# Patient Record
Sex: Male | Born: 1965 | Race: White | Hispanic: No | Marital: Married | Smoking: Never smoker
Health system: Southern US, Community
[De-identification: ages and names within clinical notes are randomized; demographics above are authoritative.]

## PROBLEM LIST (undated history)

## (undated) HISTORY — PX: OTHER SURGICAL HISTORY: SHX169

## (undated) HISTORY — PX: TONSILLECTOMY: SUR1361

---

## 1999-12-20 ENCOUNTER — Other Ambulatory Visit: Admission: RE | Admit: 1999-12-20 | Discharge: 1999-12-20 | Payer: Self-pay | Admitting: Urology

## 2002-01-06 ENCOUNTER — Encounter: Payer: Self-pay | Admitting: Family Medicine

## 2002-01-06 ENCOUNTER — Encounter: Admission: RE | Admit: 2002-01-06 | Discharge: 2002-01-06 | Payer: Self-pay | Admitting: Family Medicine

## 2004-06-11 ENCOUNTER — Encounter: Admission: RE | Admit: 2004-06-11 | Discharge: 2004-06-11 | Payer: Self-pay | Admitting: Family Medicine

## 2012-06-06 ENCOUNTER — Encounter (HOSPITAL_COMMUNITY): Payer: Self-pay | Admitting: Physical Medicine and Rehabilitation

## 2012-06-06 ENCOUNTER — Emergency Department (HOSPITAL_COMMUNITY): Payer: 59

## 2012-06-06 ENCOUNTER — Emergency Department (HOSPITAL_COMMUNITY)
Admission: EM | Admit: 2012-06-06 | Discharge: 2012-06-06 | Disposition: A | Discharge status: Home / Self Care | Payer: 59 | Attending: Emergency Medicine | Admitting: Emergency Medicine

## 2012-06-06 DIAGNOSIS — W11XXXA Fall on and from ladder, initial encounter: Secondary | ICD-10-CM | POA: Insufficient documentation

## 2012-06-06 DIAGNOSIS — S9030XA Contusion of unspecified foot, initial encounter: Secondary | ICD-10-CM | POA: Insufficient documentation

## 2012-06-06 DIAGNOSIS — Y939 Activity, unspecified: Secondary | ICD-10-CM | POA: Insufficient documentation

## 2012-06-06 DIAGNOSIS — S93609A Unspecified sprain of unspecified foot, initial encounter: Secondary | ICD-10-CM | POA: Insufficient documentation

## 2012-06-06 DIAGNOSIS — T148XXA Other injury of unspecified body region, initial encounter: Secondary | ICD-10-CM

## 2012-06-06 DIAGNOSIS — Y929 Unspecified place or not applicable: Secondary | ICD-10-CM | POA: Insufficient documentation

## 2012-06-06 MED ORDER — OXYCODONE-ACETAMINOPHEN 5-325 MG PO TABS
1.0000 | ORAL_TABLET | Freq: Once | ORAL | Status: AC
  Administered 2012-06-06: 1 via ORAL
  Filled 2012-06-06: qty 1

## 2012-06-06 NOTE — ED Notes (Signed)
Pt presents to department for evaluation of L foot pain. States he fell of ladder this morning. Now states L heel pain and swelling. Pedal pulses present. CMS intact. 8/10 pain at the time. Pt is conscious alert and oriented x4. NAD.

## 2012-06-06 NOTE — ED Notes (Signed)
Pt demonstrated understanding and proper use of cam boot and crutches.

## 2012-06-06 NOTE — ED Notes (Signed)
Pt to xray via WC

## 2012-06-06 NOTE — Progress Notes (Signed)
Orthopedic Tech Progress Note Patient Details:  Scott Bradley [birth year] [id number]  Ortho Devices Type of Ortho Device: CAM walker;Crutches Ortho Device/Splint Location: left foot Ortho Device/Splint Interventions: Application   Scott Bradley 06/06/2012, 3:58 PM

## 2012-06-06 NOTE — ED Notes (Signed)
Family at bedside. 

## 2012-06-06 NOTE — ED Provider Notes (Addendum)
History    This chart was scribed for Scott Sprout, MD, MD by Smitty Pluck, ED Scribe. The patient was seen in room TR09C and the patient's care was started at 12:57PM.   CSN: [id number]  Arrival date & time 06/06/12  1224   First MD Initiated Contact with Patient 06/06/12 1256      Chief Complaint  Patient presents with  . Foot Pain    (Consider location/radiation/quality/duration/timing/severity/associated sxs/prior treatment) Patient is a 46 y.o. male presenting with lower extremity pain. The history is provided by the patient. No language interpreter was used.  Foot Pain Pertinent negatives include no shortness of breath.   Scott Bradley is a 46 y.o. male who presents to the Emergency Department complaining of constant, moderate left foot pain onset today 1 hour ago. The pain is throbbing. Pain is rated at 8/10. Pt reports that he fell from a ladder onto concrete. He denies back pain, leg pain, toe pain and any other pain.   No past medical history on file.  No past surgical history on file.  History reviewed. No pertinent family history.  History  Substance Use Topics  . Smoking status: Never Smoker   . Smokeless tobacco: Not on file  . Alcohol Use: Yes     Comment: occ      Review of Systems  Constitutional: Negative for fever and chills.  Respiratory: Negative for shortness of breath.   Gastrointestinal: Negative for nausea and vomiting.  Neurological: Negative for weakness.  All other systems reviewed and are negative.    Allergies  Review of patient's allergies indicates no known allergies.  Home Medications  No current outpatient prescriptions on file.  BP 149/94  Pulse 71  Temp 98 F (36.7 C) (Oral)  Resp 16  SpO2 97%  Physical Exam  Nursing note and vitals reviewed. Constitutional: He is oriented to person, place, and time. He appears well-developed and well-nourished. No distress.  HENT:  Head: Normocephalic and atraumatic.  Eyes:  EOM are normal.  Neck: Neck supple. No tracheal deviation present.  Pulmonary/Chest: Effort normal. No respiratory distress.  Musculoskeletal: Normal range of motion.       Left knee: Normal.       Cervical back: Normal.       Thoracic back: Normal.       Lumbar back: Normal.       calcaneus is tenderness, swollen Nl left achilles tendon  Nl left ankle   Neurological: He is alert and oriented to person, place, and time.  Skin: Skin is warm and dry.  Psychiatric: He has a normal mood and affect. His behavior is normal.    ED Course  Procedures (including critical care time) DIAGNOSTIC STUDIES: Oxygen Saturation is 97% on room air, normal by my interpretation.    COORDINATION OF CARE: 12:58 PM Discussed ED treatment with pt     Labs Reviewed - No data to display Ct Foot Left Wo Contrast  06/06/2012  *RADIOLOGY REPORT*  Clinical Data: .  From a ladder.  Medial bruising along the heel. Possible fracture.  CT OF THE LEFT FOOT WITHOUT CONTRAST  Technique:  Multidetector CT imaging was performed according to the standard protocol. Multiplanar CT image reconstructions were also generated.  Comparison: 06/06/2012 radiographs  Findings: Well corticated accessory navicular noted.  Well corticated bifid os peroneus noted adjacent to the cuboid.  No malalignment at the Lisfranc joint.  No metatarsal or phalangeal fracture observed.  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  Mild edema noted in Kager's fat pad.  There is also subcutaneous edema along the heel, particularly posteromedially, and also tracking along the thickened lateral band of the plantar fascia.  Faint linear calcifications node along the tibiotalar component of the deltoid ligament near the tibial attachment.   Slightly greater than expected fluid density noted along the deep tibiotalar components of the deltoid ligament.  Sprain of the tibiotalar component of the deltoid ligament suspected.  No cuboid  fracture or calcaneal fracture is observed.  IMPRESSION:  1.  Abnormal fusiform expansion of the Achilles tendon and may reflect tendinopathy or partial tearing.  There is low-level adjacent edema in Kager's fat pad. 2.  Greater than expected fluid signal fluid density along the tibiotalar component of the deltoid ligament, with some adjacent faint linear calcification, suspicious for sprain and perhaps subtle avulsion. 3.  Edema tracking along the heel, especially posteromedially, in the subcutaneous tissues. 4.  Thickened lateral band of the plantar fascia is of uncertain significance, but could reflect plantar fasciitis or conceivably plantar fascial injury.   Original Report Authenticated By: Gaylyn Rong, M.D.    Dg Foot Complete Left  06/06/2012  *RADIOLOGY REPORT*  Clinical Data: Pain after injury.  LEFT FOOT - COMPLETE 3+ VIEW  Comparison: None.  Findings: Examination demonstrates two small well corticated bony fragments adjacent the plantar lateral aspect of the cuboid likely chronic.  The lateral film demonstrates one of these fragments along the posterior plantar aspect of the cuboid although demonstrates findings suggesting the possibility of an acute chip fracture.  Remainder of the exam is unremarkable.  IMPRESSION: Two small bony fragments adjacent the plantar lateral aspect of the cuboid likely chronic although cannot completely exclude an acute chip fracture.   Original Report Authenticated By: Elberta Fortis, M.D.      1. Foot contusion   2. Ligament tear       MDM   Patient with a mechanical fall today about one hour ago from a ladder. He fell on hard dirt approximately 15-20 feet with complaints of pain in the left heel. He did not hit his head no LOC. He has no lumbar tenderness, left hip, knee or ankle pain. His Achilles tendon is intact. Swelling and severe pain over the calcaneus. Concern for calcaneal fracture and plain films are pending. Patient is neurovascularly intact  with normal sensation and pulses.  1:52 PM Plain films with 2 small bony fragments without definitive fx.  Concern for fracture due to pt's swelling and pain.  Will get CT to r/o occult fx.  3:34 PM No fracture seen. Multiple areas of contusion in ligamental injury. Patient was placed in a splint and will have followup with ortho in one week. I personally performed the services described in this documentation, which was scribed in my presence.  The recorded information has been reviewed and considered.     Scott Sprout, MD 06/06/12 1306  Scott Sprout, MD 06/06/12 1307  Scott Sprout, MD 06/06/12 1353  Scott Sprout, MD 06/06/12 1457  Scott Sprout, MD 06/06/12 1534  Scott Sprout, MD 06/06/12 1540

## 2012-06-06 NOTE — ED Notes (Signed)
Pt returned from CT °

## 2013-08-21 IMAGING — CT CT FOOT*L* W/O CM
5 of 6 series · 17 of 34 positions shown, 18 images · non-contrast
Comparison: 06/06/2012 radiographs

CLINICAL DATA: .  From a ladder.  Medial bruising along the heel.
Possible fracture.

CT OF THE LEFT FOOT WITHOUT CONTRAST
TECHNIQUE: Multidetector CT imaging was performed according to the
standard protocol. Multiplanar CT image reconstructions were also
generated.

[Series 5: lowextremity 2.0 b20s · axial · 0.59mm/px · z∈[-989,-913]mm · 3 of 76 slices shown, 4 images]
[im 19/76  soft-tissue]
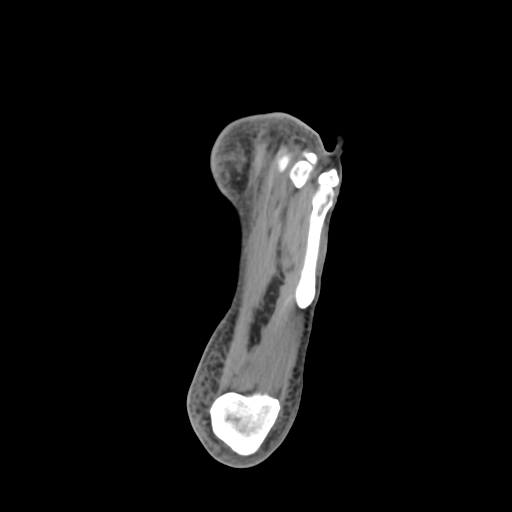
[im 19/76  bone]
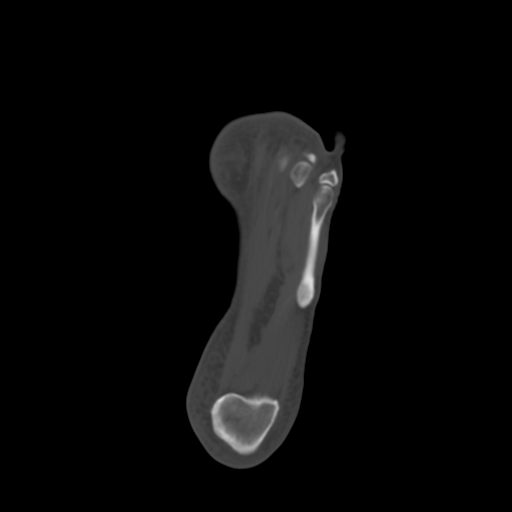
[im 38/76  bone]
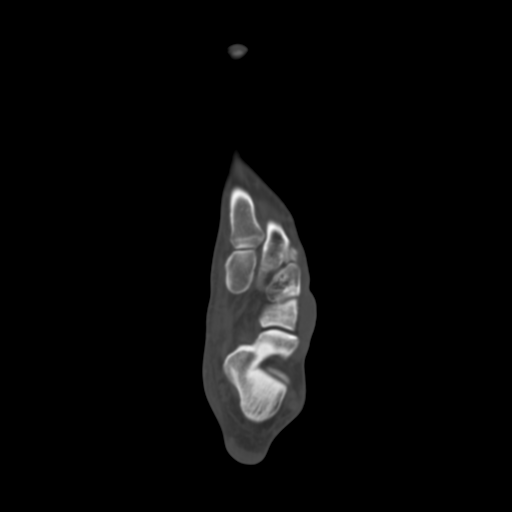
[im 57/76  bone]
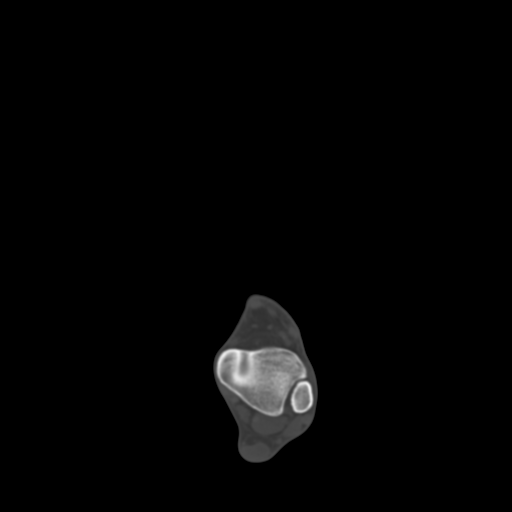

[Series 401: calc · sagittal · 0.51mm/px · 6 of 57 slices shown]
[im 9/57  bone]
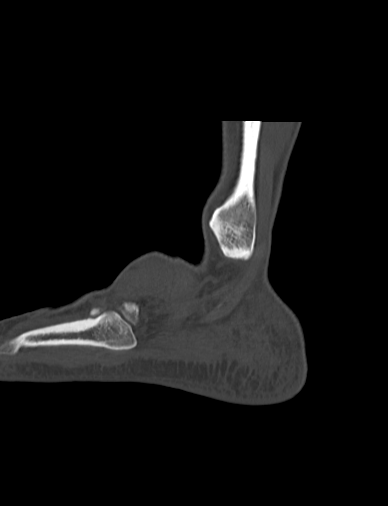
[im 17/57  bone]
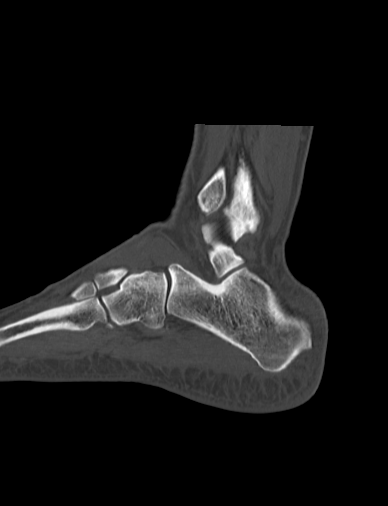
[im 21/57  soft-tissue]
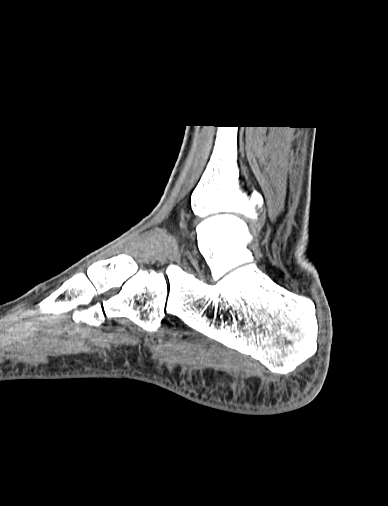
[im 25/57  bone]
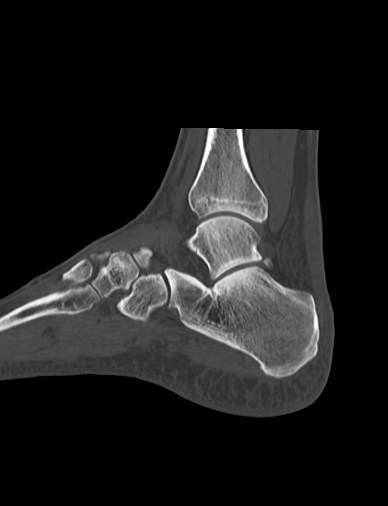
[im 33/57  bone]
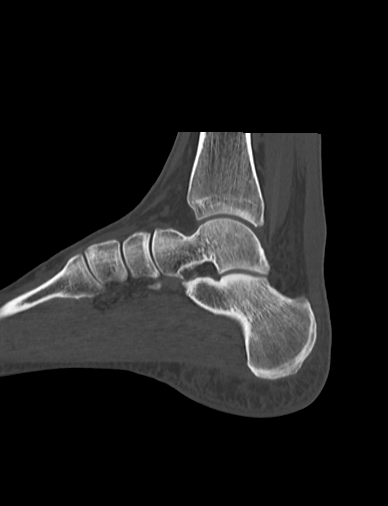
[im 41/57  bone]
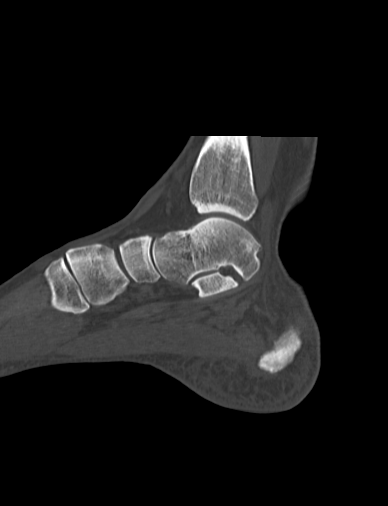

[Series 602: axial · coronal · 0.59mm/px · 3 of 130 slices shown]
[im 26/130  bone]
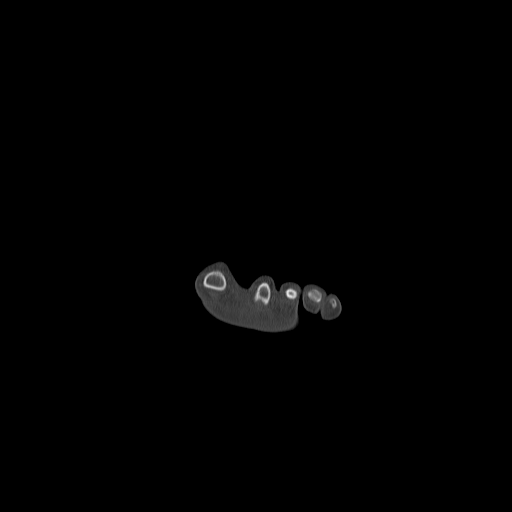
[im 52/130  bone]
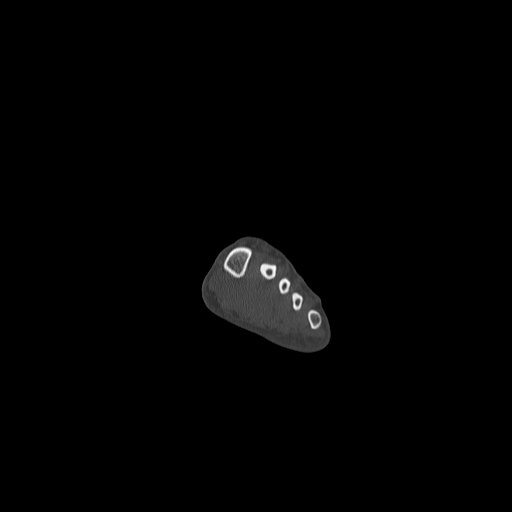
[im 78/130  bone]
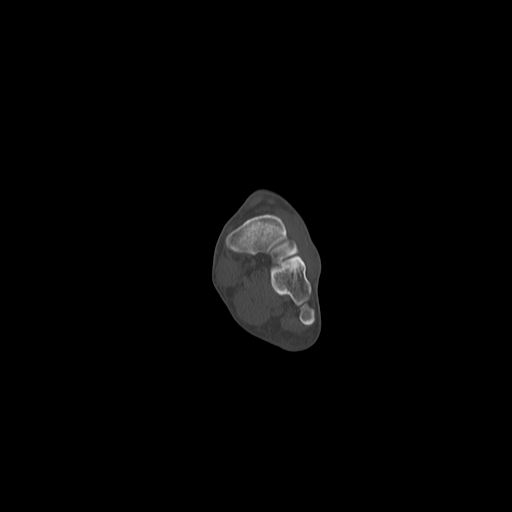

[Series 604: coronal · axial · 0.59mm/px · z∈[-952,-913]mm · 2 of 59 slices shown]
[im 20/59  bone]
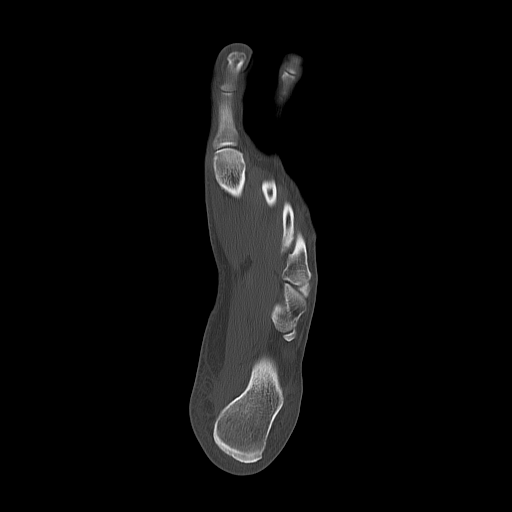
[im 39/59  bone]
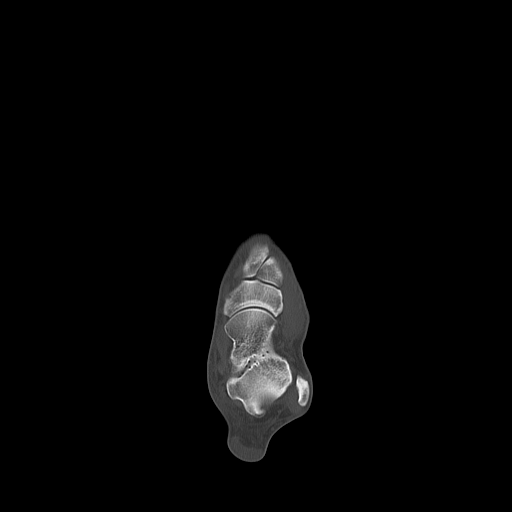

[Series 606: coronal tissue · axial · 0.59mm/px · z∈[-971,-906]mm · 3 of 66 slices shown]
[im 17/66  bone]
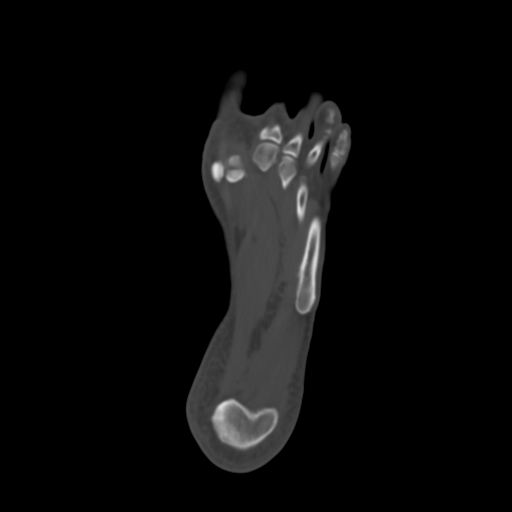
[im 33/66  bone]
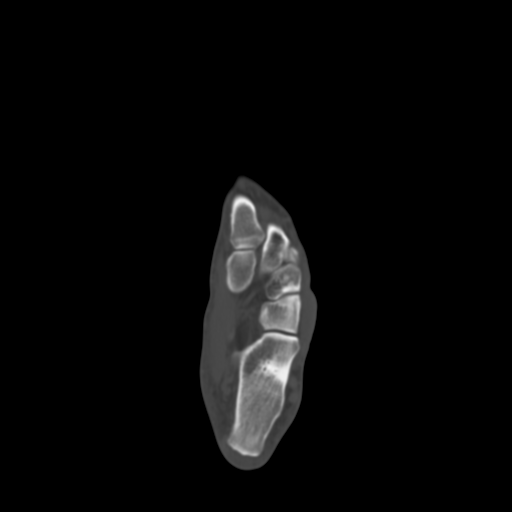
[im 49/66  bone]
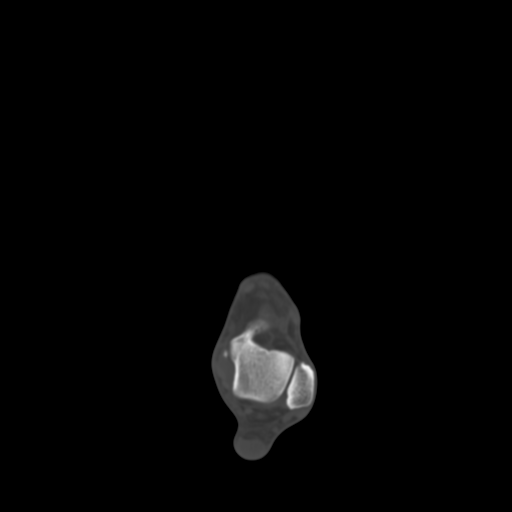

[17 of 34 positions shown; findings below may reference images not displayed]

FINDINGS: Well corticated accessory navicular noted.  Well
corticated bifid os peroneus noted adjacent to the cuboid.

No malalignment at the Lisfranc joint.  No metatarsal or phalangeal
fracture observed.

Fusiform expansion of the Achilles tendon favors Achilles
tendinopathy.  Mild edema noted in Kager's fat pad.  There is also
subcutaneous edema along the heel, particularly posteromedially,
and also tracking along the thickened lateral band of the plantar
fascia.

Faint linear calcifications node along the tibiotalar component of
the deltoid ligament near the tibial attachment.   Slightly greater
than expected fluid density noted along the deep tibiotalar
components of the deltoid ligament.  Sprain of the tibiotalar
component of the deltoid ligament suspected.

No cuboid fracture or calcaneal fracture is observed.
IMPRESSION: 1.  Abnormal fusiform expansion of the Achilles tendon and may
reflect tendinopathy or partial tearing.  There is low-level
adjacent edema in Kager's fat pad.
2.  Greater than expected fluid signal fluid density along the
tibiotalar component of the deltoid ligament, with some adjacent
faint linear calcification, suspicious for sprain and perhaps
subtle avulsion.
3.  Edema tracking along the heel, especially posteromedially, in
the subcutaneous tissues.
4.  Thickened lateral band of the plantar fascia is of uncertain
significance, but could reflect plantar fasciitis or conceivably
plantar fascial injury.

## 2014-08-17 ENCOUNTER — Ambulatory Visit (AMBULATORY_SURGERY_CENTER): Payer: Self-pay

## 2014-08-17 VITALS — Ht 69.0 in | Wt 192.0 lb

## 2014-08-17 DIAGNOSIS — Z1211 Encounter for screening for malignant neoplasm of colon: Secondary | ICD-10-CM

## 2014-08-17 MED ORDER — MOVIPREP 100 G PO SOLR: 1.0000 | Freq: Once | ORAL | Status: DC

## 2014-08-17 NOTE — Progress Notes (Signed)
No allergies to eggs or soy No past problems with anesthesia No home oxygen No diet/weight loss meds  Has email  Emmi instructions given for colonoscopy 

## 2014-08-29 ENCOUNTER — Encounter: Payer: Self-pay | Admitting: Internal Medicine

## 2014-08-31 ENCOUNTER — Encounter: Payer: 59 | Admitting: Internal Medicine

## 2014-09-09 ENCOUNTER — Ambulatory Visit (AMBULATORY_SURGERY_CENTER): Payer: Managed Care, Other (non HMO) | Admitting: Internal Medicine

## 2014-09-09 ENCOUNTER — Encounter: Payer: Self-pay | Admitting: Internal Medicine

## 2014-09-09 VITALS — BP 132/88 | HR 57 | Temp 97.5°F | Resp 14 | Ht 69.0 in | Wt 192.0 lb

## 2014-09-09 DIAGNOSIS — Z1211 Encounter for screening for malignant neoplasm of colon: Secondary | ICD-10-CM

## 2014-09-09 MED ORDER — SODIUM CHLORIDE 0.9 % IV SOLN: 500.0000 mL | INTRAVENOUS | Status: DC

## 2014-09-09 NOTE — Op Note (Signed)
 [facility] [address] [place], [id number]   COLONOSCOPY PROCEDURE REPORT  PATIENT: [name], Scott E  MR#: [id number] BIRTHDATE: 1965/11/13 , 48  yrs. old GENDER: male ENDOSCOPIST: Roxy CedarJohn N Perry Jr, MD REFERRED XB:JYNWGBY:Scott Link SnufferHolwerda, M.D. PROCEDURE DATE:  09/09/2014 PROCEDURE:   Colonoscopy, screening First Screening Colonoscopy - Avg.  risk and is 50 yrs.  old or older Yes.  Prior Negative Screening - Now for repeat screening. N/A  History of Adenoma - Now for follow-up colonoscopy & has been > or = to 3 yrs.  N/A  Polyps Removed Today? No.  Recommend repeat exam, <10 yrs? No. ASA CLASS:   Class I INDICATIONS:average risk patient for colorectal cancer. MEDICATIONS: Monitored anesthesia care and Propofol 300 mg IV  DESCRIPTION OF PROCEDURE:   After the risks benefits and alternatives of the procedure were thoroughly explained, informed consent was obtained.  The digital rectal exam revealed no abnormalities of the rectum.   The LB [id number]  endoscope was introduced through the anus and advanced to the cecum, which was identified by both the appendix and ileocecal valve. No adverse events experienced.   The quality of the prep was excellent, using MoviPrep  The instrument was then slowly withdrawn as the colon was fully examined.    COLON FINDINGS: The examined terminal ileum appeared to be normal. There was moderate diverticulosis in the sigmoid colon with a traction diverticulum at 30 cm. Also, rare scattered diverticula in the right colon.   The examination was otherwise normal. No polyps identified.  Retroflexed views revealed internal hemorrhoids. The time to cecum=4 minutes 12 seconds.  Withdrawal time=11 minutes 59 seconds.  The scope was withdrawn and the procedure completed. COMPLICATIONS: There were no immediate complications.  ENDOSCOPIC IMPRESSION: 1.   The examined terminal ileum was normal 2.   Moderate diverticulosis 3.   The  examination was otherwise normal . No polyps  RECOMMENDATIONS: 1.  Continue current colorectal screening recommendations for "routine risk" patients with a repeat colonoscopy in 10 years.  eSigned:  Roxy CedarJohn N Perry Jr, MD 09/09/2014 1:52 PM   cc: The Patient    ; Cletis AthensScott Holwerda,MD

## 2014-09-09 NOTE — Progress Notes (Signed)
Report to PACU, RN, vss, BBS= Clear.  

## 2014-09-09 NOTE — Patient Instructions (Signed)

## 2014-09-12 ENCOUNTER — Telehealth: Payer: Self-pay | Admitting: *Deleted

## 2014-09-12 NOTE — Telephone Encounter (Signed)
  Follow up Call-  Call back number 09/09/2014  Post procedure Call Back phone  # [phone]  Permission to leave phone message Yes     Patient questions:  Do you have a fever, pain , or abdominal swelling? No. Pain Score  0 *  Have you tolerated food without any problems? Yes.    Have you been able to return to your normal activities? Yes.    Do you have any questions about your discharge instructions: Diet   No. Medications  No. Follow up visit  No.  Do you have questions or concerns about your Care? No.  Actions: * If pain score is 4 or above: No action needed, pain <4.
